# Patient Record
Sex: Male | Born: 2000
Health system: Southern US, Community
[De-identification: ages and names within clinical notes are randomized; demographics above are authoritative.]

## PROBLEM LIST (undated history)

## (undated) HISTORY — PX: APPENDECTOMY: SHX54

---
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
## 2019-06-25 ENCOUNTER — Ambulatory Visit: Payer: BC Managed Care – PPO | Attending: Internal Medicine

## 2019-06-25 DIAGNOSIS — Z20822 Contact with and (suspected) exposure to covid-19: Secondary | ICD-10-CM

## 2019-06-26 LAB — NOVEL CORONAVIRUS, NAA: SARS-CoV-2, NAA: NOT DETECTED

## 2020-11-27 ENCOUNTER — Encounter: Payer: Self-pay | Admitting: Otolaryngology

## 2020-12-12 NOTE — Discharge Instructions (Signed)
[facility] ENDOSCOPIC SINUS SURGERY [facility]  What is Functional Endoscopic Sinus Surgery?  The Surgery involves making the natural openings of the sinuses larger by removing the bony partitions that separate the sinuses from the nasal cavity.  The natural sinus lining is preserved as much as possible to allow the sinuses to resume normal function after the surgery.  In some patients nasal polyps (excessively swollen lining of the sinuses) may be removed to relieve obstruction of the sinus openings.  The surgery is performed through the nose using lighted scopes, which eliminates the need for incisions on the face.  A septoplasty is a different procedure which is sometimes performed with sinus surgery.  It involves straightening the boy partition that separates the two sides of your nose.  A crooked or deviated septum may need repair if is obstructing the sinuses or nasal airflow.  Turbinate reduction is also often performed during sinus surgery.  The turbinates are bony proturberances from the side walls of the nose which swell and can obstruct the nose in patients with sinus and allergy problems.  Their size can be surgically reduced to help relieve nasal obstruction.  What Can Sinus Surgery Do For Me?  Sinus surgery can reduce the frequency of sinus infections requiring antibiotic treatment.  This can provide improvement in nasal congestion, post-nasal drainage, facial pressure and nasal obstruction.  Surgery will NOT prevent you from ever having an infection again, so it usually only for patients who get infections 4 or more times yearly requiring antibiotics, or for infections that do not clear with antibiotics.  It will not cure nasal allergies, so patients with allergies may still require medication to treat their allergies after surgery. Surgery may improve headaches related to sinusitis, however, some people will continue to  require medication to control sinus headaches related to allergies.  Surgery will do nothing for other forms of headache (migraine, tension or cluster).  What Are the Risks of Endoscopic Sinus Surgery?  Current techniques allow surgery to be performed safely with little risk, however, there are rare complications that patients should be aware of.  Because the sinuses are located around the eyes, there is risk of eye injury, including blindness, though again, this would be quite rare. This is usually a result of bleeding behind the eye during surgery, which puts the vision oat risk, though there are treatments to protect the vision and prevent permanent disrupted by surgery causing a leak of the spinal fluid that surrounds the brain.  More serious complications would include bleeding inside the brain cavity or damage to the brain.  Again, all of these complications are uncommon, and spinal fluid leaks can be safely managed surgically if they occur.  The most common complication of sinus surgery is bleeding from the nose, which may require packing or cauterization of the nose.  Continued sinus have polyps may experience recurrence of the polyps requiring revision surgery.  Alterations of sense of smell or injury to the tear ducts are also rare complications.   What is the Surgery Like, and what is the Recovery?  The Surgery usually takes a couple of hours to perform, and is usually performed under a general anesthetic (completely asleep).  Patients are usually discharged home after a couple of hours.  Sometimes during surgery it is necessary to pack the nose to control bleeding, and the packing is left in place for 24 - 48 hours, and removed by your surgeon.    If a septoplasty was performed during the procedure, there is often a splint placed which must be removed after 5-7 days.   Discomfort: Pain is usually mild to moderate, and can be controlled by prescription pain medication or acetaminophen (Tylenol).   Aspirin, Ibuprofen (Advil, Motrin), or Naprosyn (Aleve) should be avoided, as they can cause increased bleeding.  Most patients feel sinus pressure like they have a bad head cold for several days.  Sleeping with your head elevated can help reduce swelling and facial pressure, as can ice packs over the face.  A humidifier may be helpful to keep the mucous and blood from drying in the nose.   Diet: There are no specific diet restrictions, however, you should generally start with clear liquids and a light diet of bland foods because the anesthetic can cause some nausea.  Advance your diet depending on how your stomach feels.  Taking your pain medication with food will often help reduce stomach upset which pain medications can cause.  Nasal Saline Irrigation: It is important to remove blood clots and dried mucous from the nose as it is healing.  This is done by having you irrigate the nose at least 3 - 4 times daily with a salt water solution.  We recommend using NeilMed Sinus Rinse (available at the drug store).  Fill the squeeze bottle with the solution, bend over a sink, and insert the tip of the squeeze bottle into the nose  of an inch.  Point the tip of the squeeze bottle towards the inside corner of the eye on the same side your irrigating.  Squeeze the bottle and gently irrigate the nose.  If you bend forward as you do this, most of the fluid will flow back out of the nose, instead of down your throat.   The solution should be warm, near body temperature, when you irrigate.   Each time you irrigate, you should use a full squeeze bottle.   Note that if you are instructed to use Nasal Steroid Sprays at any time after your surgery, irrigate with saline BEFORE using the steroid spray, so you do not wash it all out of the nose. Another product, Nasal Saline Gel (such as AYR Nasal Saline Gel) can be applied in each nostril 3 - 4 times daily to moisture the nose and reduce scabbing or crusting.  Bleeding:   Bloody drainage from the nose can be expected for several days, and patients are instructed to irrigate their nose frequently with salt water to help remove mucous and blood clots.  The drainage may be dark red or brown, though some fresh blood may be seen intermittently, especially after irrigation.  Do not blow you nose, as bleeding may occur. If you must sneeze, keep your mouth open to allow air to escape through your mouth.  If heavy bleeding occurs: Irrigate the nose with saline to rinse out clots, then spray the nose 3 - 4 times with Afrin Nasal Decongestant Spray.  The spray will constrict the blood vessels to slow bleeding.  Pinch the lower half of your nose shut to apply pressure, and lay down with your head elevated.  Ice packs over the nose may help as well. If bleeding persists despite these measures, you should notify your doctor.  Do not use the Afrin routinely to control nasal congestion after surgery, as it can result in worsening congestion and may affect healing.     Activity: Return to work varies among patients. Most patients will be   out of work at least 5 - 7 days to recover.  Patient may return to work after they are off of narcotic pain medication, and feeling well enough to perform the functions of their job.  Patients must avoid heavy lifting (over 10 pounds) or strenuous physical for 2 weeks after surgery, so your employer may need to assign you to light duty, or keep you out of work longer if light duty is not possible.  NOTE: you should not drive, operate dangerous machinery, do any mentally demanding tasks or make any important legal or financial decisions while on narcotic pain medication and recovering from the general anesthetic.    Call Your Doctor Immediately if You Have Any of the Following: Bleeding that you cannot control with the above measures Loss of vision, double vision, bulging of the eye or black eyes. Fever over 101 degrees Neck stiffness with severe headache,  fever, nausea and change in mental state. You are always encourage to call anytime with concerns, however, please call with requests for pain medication refills during office hours.  Office Endoscopy: During follow-up visits your doctor will remove any packing or splints that may have been placed and evaluate and clean your sinuses endoscopically.  Topical anesthetic will be used to make this as comfortable as possible, though you may want to take your pain medication prior to the visit.  How often this will need to be done varies from patient to patient.  After complete recovery from the surgery, you may need follow-up endoscopy from time to time, particularly if there is concern of recurrent infection or nasal polyps.   

## 2020-12-16 ENCOUNTER — Encounter
Admission: RE | Disposition: A | Discharge status: Home / Self Care | Payer: Self-pay | Source: Home / Self Care | Attending: Otolaryngology

## 2020-12-16 ENCOUNTER — Other Ambulatory Visit: Payer: Self-pay

## 2020-12-16 ENCOUNTER — Ambulatory Visit: Payer: BC Managed Care – PPO | Admitting: Anesthesiology

## 2020-12-16 ENCOUNTER — Encounter: Payer: Self-pay | Admitting: Otolaryngology

## 2020-12-16 ENCOUNTER — Ambulatory Visit
Admission: RE | Admit: 2020-12-16 | Discharge: 2020-12-16 | Disposition: A | Discharge status: Home / Self Care | Payer: BC Managed Care – PPO | Attending: Otolaryngology | Admitting: Otolaryngology

## 2020-12-16 DIAGNOSIS — J342 Deviated nasal septum: Secondary | ICD-10-CM | POA: Insufficient documentation

## 2020-12-16 DIAGNOSIS — J343 Hypertrophy of nasal turbinates: Secondary | ICD-10-CM | POA: Diagnosis not present

## 2020-12-16 DIAGNOSIS — Z87891 Personal history of nicotine dependence: Secondary | ICD-10-CM | POA: Insufficient documentation

## 2020-12-16 HISTORY — PX: NASAL SEPTOPLASTY W/ TURBINOPLASTY: SHX2070

## 2020-12-16 SURGERY — SEPTOPLASTY, NOSE, WITH NASAL TURBINATE REDUCTION
Anesthesia: General | Site: Nose | Laterality: Bilateral

## 2020-12-16 MED ORDER — OXYCODONE HCL 5 MG/5ML PO SOLN
5.0000 mg | Freq: Once | ORAL | Status: AC | PRN
  Administered 2020-12-16: 5 mg via ORAL

## 2020-12-16 MED ORDER — PROPOFOL 10 MG/ML IV BOLUS
INTRAVENOUS | Status: DC | PRN
  Administered 2020-12-16: 180 mg via INTRAVENOUS

## 2020-12-16 MED ORDER — AMOXICILLIN 875 MG PO TABS: 875.0000 mg | ORAL_TABLET | Freq: Two times a day (BID) | ORAL | 0 refills | Status: AC

## 2020-12-16 MED ORDER — FENTANYL CITRATE (PF) 100 MCG/2ML IJ SOLN: 25.0000 ug | INTRAMUSCULAR | Status: DC | PRN

## 2020-12-16 MED ORDER — HYDROCODONE-ACETAMINOPHEN 5-325 MG PO TABS: 1.0000 | ORAL_TABLET | Freq: Four times a day (QID) | ORAL | 0 refills | Status: AC | PRN

## 2020-12-16 MED ORDER — LACTATED RINGERS IV SOLN: INTRAVENOUS | Status: DC

## 2020-12-16 MED ORDER — ACETAMINOPHEN 10 MG/ML IV SOLN
1000.0000 mg | Freq: Once | INTRAVENOUS | Status: AC
  Administered 2020-12-16: 1000 mg via INTRAVENOUS

## 2020-12-16 MED ORDER — OXYMETAZOLINE HCL 0.05 % NA SOLN
NASAL | Status: DC | PRN
  Administered 2020-12-16: 1 via TOPICAL

## 2020-12-16 MED ORDER — DEXAMETHASONE SODIUM PHOSPHATE 4 MG/ML IJ SOLN
INTRAMUSCULAR | Status: DC | PRN
  Administered 2020-12-16: 10 mg via INTRAVENOUS
  Administered 2020-12-16: 5 mg via INTRAVENOUS

## 2020-12-16 MED ORDER — FENTANYL CITRATE (PF) 100 MCG/2ML IJ SOLN
INTRAMUSCULAR | Status: DC | PRN
  Administered 2020-12-16: 100 ug via INTRAVENOUS

## 2020-12-16 MED ORDER — SUCCINYLCHOLINE CHLORIDE 20 MG/ML IJ SOLN
INTRAMUSCULAR | Status: DC | PRN
  Administered 2020-12-16: 100 mg via INTRAVENOUS

## 2020-12-16 MED ORDER — GLYCOPYRROLATE 0.2 MG/ML IJ SOLN
INTRAMUSCULAR | Status: DC | PRN
Start: 2020-12-16 — End: 2020-12-16
  Administered 2020-12-16: .1 mg via INTRAVENOUS

## 2020-12-16 MED ORDER — OXYCODONE HCL 5 MG PO TABS: 5.0000 mg | ORAL_TABLET | Freq: Once | ORAL | Status: AC | PRN

## 2020-12-16 MED ORDER — MIDAZOLAM HCL 5 MG/5ML IJ SOLN
INTRAMUSCULAR | Status: DC | PRN
  Administered 2020-12-16: 2 mg via INTRAVENOUS

## 2020-12-16 MED ORDER — ONDANSETRON HCL 4 MG/2ML IJ SOLN
INTRAMUSCULAR | Status: DC | PRN
  Administered 2020-12-16: 4 mg via INTRAVENOUS

## 2020-12-16 MED ORDER — LIDOCAINE-EPINEPHRINE 1 %-1:100000 IJ SOLN
INTRAMUSCULAR | Status: DC | PRN
  Administered 2020-12-16: 10 mL

## 2020-12-16 MED ORDER — ONDANSETRON HCL 4 MG/2ML IJ SOLN: 4.0000 mg | Freq: Once | INTRAMUSCULAR | Status: DC | PRN

## 2020-12-16 SURGICAL SUPPLY — 22 items
CANISTER SUCT 1200ML W/VALVE (MISCELLANEOUS) ×2 IMPLANT
COAG SUCT 10F 3.5MM HAND CTRL (MISCELLANEOUS) ×2 IMPLANT
ELECT REM PT RETURN 9FT ADLT (ELECTROSURGICAL) ×2
ELECTRODE REM PT RTRN 9FT ADLT (ELECTROSURGICAL) ×1 IMPLANT
GAUZE 4X4 16PLY ~~LOC~~+RFID DBL (SPONGE) ×1 IMPLANT
GLOVE SURG ENC MOIS LTX SZ7.5 (GLOVE) ×4 IMPLANT
GOWN STRL REUS W/ TWL LRG LVL3 (GOWN DISPOSABLE) ×1 IMPLANT
GOWN STRL REUS W/TWL LRG LVL3 (GOWN DISPOSABLE) ×2
KIT TURNOVER KIT A (KITS) ×2 IMPLANT
NDL HYPO 25GX1X1/2 BEV (NEEDLE) ×1 IMPLANT
NEEDLE HYPO 25GX1X1/2 BEV (NEEDLE) ×2 IMPLANT
PACK ENT CUSTOM (PACKS) ×2 IMPLANT
PATTIES SURGICAL .5 X3 (DISPOSABLE) ×2 IMPLANT
SPLINT NASAL SEPTAL PRE-CUT (MISCELLANEOUS) ×2 IMPLANT
SUT CHROMIC 4 0 RB 1X27 (SUTURE) ×1 IMPLANT
SUT ETHILON 4-0 (SUTURE) ×2
SUT ETHILON 4-0 FS2 18XMFL BLK (SUTURE) ×1
SUT PLAIN GUT 4-0 (SUTURE) ×1 IMPLANT
SUTURE ETHLN 4-0 FS2 18XMF BLK (SUTURE) IMPLANT
SYR 10ML LL (SYRINGE) ×2 IMPLANT
TOWEL OR 17X26 4PK STRL BLUE (TOWEL DISPOSABLE) ×2 IMPLANT
WATER STERILE IRR 250ML POUR (IV SOLUTION) ×2 IMPLANT

## 2020-12-16 NOTE — Anesthesia Preprocedure Evaluation (Signed)
Anesthesia Evaluation  Patient identified by MRN, date of birth, ID band Patient awake    Reviewed: Allergy & Precautions, NPO status , Patient's Chart, lab work & pertinent test results  Airway Mallampati: II  TM Distance: >3 FB Neck ROM: Full    Dental no notable dental hx.    Pulmonary neg pulmonary ROS,    Pulmonary exam normal        Cardiovascular Exercise Tolerance: Good negative cardio ROS Normal cardiovascular exam     Neuro/Psych negative neurological ROS     GI/Hepatic negative GI ROS, Neg liver ROS,   Endo/Other  negative endocrine ROS  Renal/GU negative Renal ROS     Musculoskeletal negative musculoskeletal ROS (+)   Abdominal Normal abdominal exam  (+)   Peds  Hematology negative hematology ROS (+)   Anesthesia Other Findings Deviated nasal septum  Reproductive/Obstetrics                            Anesthesia Physical Anesthesia Plan  ASA: 1  Anesthesia Plan: General   Post-op Pain Management:    Induction: Intravenous  PONV Risk Score and Plan: 2 and Ondansetron, Midazolam, Treatment may vary due to age or medical condition and Dexamethasone  Airway Management Planned: Oral ETT  Additional Equipment: None  Intra-op Plan:   Post-operative Plan:   Informed Consent: I have reviewed the patients History and Physical, chart, labs and discussed the procedure including the risks, benefits and alternatives for the proposed anesthesia with the patient or authorized representative who has indicated his/her understanding and acceptance.     Dental advisory given  Plan Discussed with: CRNA  Anesthesia Plan Comments:         Anesthesia Quick Evaluation

## 2020-12-16 NOTE — Anesthesia Procedure Notes (Signed)
Procedure Name: Intubation Date/Time: 12/16/2020 8:47 AM Performed by: Jimmy Picket, CRNA Pre-anesthesia Checklist: Patient identified, Emergency Drugs available, Suction available, Patient being monitored and Timeout performed Patient Re-evaluated:Patient Re-evaluated prior to induction Oxygen Delivery Method: Circle system utilized Preoxygenation: Pre-oxygenation with 100% oxygen Induction Type: IV induction Ventilation: Mask ventilation without difficulty Laryngoscope Size: Miller and 3 Grade View: Grade I Tube type: Oral Rae Tube size: 7.5 mm Number of attempts: 1 Placement Confirmation: ETT inserted through vocal cords under direct vision, positive ETCO2 and breath sounds checked- equal and bilateral Tube secured with: Tape Dental Injury: Teeth and Oropharynx as per pre-operative assessment

## 2020-12-16 NOTE — Anesthesia Postprocedure Evaluation (Signed)
Anesthesia Post Note  Patient: Eric Campos  Procedure(s) Performed: NASAL SEPTOPLASTY WITH INFERIOR TURBINATE SUBMUCOUS RESECTION (Bilateral: Nose)     Patient location during evaluation: PACU Anesthesia Type: General Level of consciousness: awake and alert Pain management: pain level controlled Vital Signs Assessment: post-procedure vital signs reviewed and stable Respiratory status: spontaneous breathing and nonlabored ventilation Cardiovascular status: blood pressure returned to baseline Postop Assessment: no apparent nausea or vomiting Anesthetic complications: no   No notable events documented.  Daylynn Stumpp [employer]

## 2020-12-16 NOTE — Op Note (Signed)
12/16/2020  10:08 AM    Eric Campos  [id number]   Pre-Op Diagnosis:  Nasal obstruction with septal deviation and inferior turbinate hypertrophy  Post-op Diagnosis: Same  Procedure: 1) Nasal Septoplasty, 2) Bilateral submucous resection of the inferior turbinates  Surgeon:  Sandi Mealy  Anesthesia:  General endotracheal  EBL:  20cc  Complications:  None  Findings: Right septal deviation, left septal spur. Hypertrophy of the inferior turbinates  Procedure: The patient was taken to the Operating Room and placed in the supine position.  After induction of general endotracheal anesthesia, the table was turned 90 degrees. A time-out was issued to confirm the site and procedure. The nasal septum and inferior turbinates where then injected with 1% lidocaine with epiniephrine, 1:100,000. The nose was decongested with Afrin soaked pledgets. The patient was then prepped and draped in the usual sterile fashion. Beginning on the left hand side a hemitransfixion incision was then created on the leading edge of the septum on the left.  A subperichondrial plane was elevated posteriorly on the left and taken back to the perpendicular plate of the ethmoid where subperiosteal plane was elevated posteriorly on the left. A large septal spur was identified on the left hand side.  An inferior rim of redundant septal cartilage was removed from where it deviated over the maxillary crest. The perpendicular plate of the ethmoid was separated from the quadrangular cartilage, and a subperiosteal plane elevated on the right of the bony septum. The large septal spur was removed, dividing the septal bone superiorly with Knight scissors, and inferiorly from the maxillary crest with a chisel. The cartilage was scored with a 15 blade to reduce the memory of the cartilage, allowing the curved parts of the cartilage to straighten more.   The septum was then replaced in the midline. Reinspection through each nostril  showed excellent reduction of the septal deformity. A left posterior inferior fenestration was then created to allow hematoma drainage.  The septal incision was closed with 4-0 chromic gut suture. A 4-0 plain gut suture was used to reapproximate the septal flaps to the underlying cartilage, utilizing a running, quilting type stitch.   With the septoplasty completed, beginning on the left-hand side, a 15 blade was used to incise along the inferior edge of the inferior turbinate. A superior laterally based flap of the medial turbinate mucosa was then elevated. A portion of the underlying conchal bone and lateral mucosa was excised using Knight scissors. The flap was then laid back over the turbinate stump and the bleeding edge of the turbinate cauterized using suction cautery. In a similar fashion the submucous resection was performed on the right.  With the submucous resection completed bilaterally and no active bleeding, nasal septal splints were placed within each nostril and affixed to the septum using a 3-0 nylon suture.     The patient was then returned to the anesthesiologist for awakening, and was taken to the Recovery Room in stable condition.  Disposition:   PACU to home  Plan: Irrigate with saline 3-4 times daily to help clear nose. Take pain medications and antibiotics as prescribed. No strenuous activity for 2 weeks. Follow-up in 1 week for splint removal.  Sandi Mealy 12/16/2020 10:08 AM

## 2020-12-16 NOTE — Transfer of Care (Signed)
Immediate Anesthesia Transfer of Care Note  Patient: Eric Campos  Procedure(s) Performed: NASAL SEPTOPLASTY WITH INFERIOR TURBINATE SUBMUCOUS RESECTION (Bilateral: Nose)  Patient Location: PACU  Anesthesia Type: General  Level of Consciousness: awake, alert  and patient cooperative  Airway and Oxygen Therapy: Patient Spontanous Breathing and Patient connected to supplemental oxygen  Post-op Assessment: Post-op Vital signs reviewed, Patient's Cardiovascular Status Stable, Respiratory Function Stable, Patent Airway and No signs of Nausea or vomiting  Post-op Vital Signs: Reviewed and stable  Complications: No notable events documented.

## 2020-12-16 NOTE — H&P (Signed)
History and physical reviewed and will be scanned in later. No change in medical status reported by the patient or family, appears stable for surgery. All questions regarding the procedure answered, and patient (or family if a child) expressed understanding of the procedure. ? ?Eric Campos S Trinna Kunst ?@[email or web address]@ ?

## 2020-12-17 ENCOUNTER — Encounter: Payer: Self-pay | Admitting: Otolaryngology
